# Patient Record
Sex: Female | Born: 1935 | Race: White | Hispanic: No
Health system: Southern US, Community
[De-identification: ages and names within clinical notes are randomized; demographics above are authoritative.]

---

## 2003-12-18 ENCOUNTER — Inpatient Hospital Stay: Payer: Self-pay | Admitting: Internal Medicine

## 2003-12-18 ENCOUNTER — Ambulatory Visit: Payer: Self-pay | Admitting: Internal Medicine

## 2003-12-25 ENCOUNTER — Inpatient Hospital Stay: Payer: Self-pay | Admitting: Internal Medicine

## 2003-12-25 ENCOUNTER — Other Ambulatory Visit: Payer: Self-pay

## 2007-06-19 ENCOUNTER — Ambulatory Visit: Payer: Self-pay | Admitting: Gynecologic Oncology

## 2007-06-21 ENCOUNTER — Ambulatory Visit: Payer: Self-pay | Admitting: Gynecologic Oncology

## 2007-06-22 ENCOUNTER — Ambulatory Visit: Payer: Self-pay | Admitting: Gynecologic Oncology

## 2007-07-06 ENCOUNTER — Ambulatory Visit: Payer: Self-pay | Admitting: Gynecologic Oncology

## 2007-07-17 ENCOUNTER — Ambulatory Visit: Payer: Self-pay | Admitting: Gynecologic Oncology

## 2007-07-24 ENCOUNTER — Inpatient Hospital Stay: Payer: Self-pay | Admitting: Gynecologic Oncology

## 2007-08-07 ENCOUNTER — Ambulatory Visit: Payer: Self-pay | Admitting: Gynecologic Oncology

## 2007-09-18 ENCOUNTER — Ambulatory Visit: Payer: Self-pay | Admitting: Gynecologic Oncology

## 2007-10-30 ENCOUNTER — Ambulatory Visit: Payer: Self-pay | Admitting: Gynecologic Oncology

## 2008-01-02 ENCOUNTER — Ambulatory Visit: Payer: Self-pay | Admitting: Gynecologic Oncology

## 2008-01-06 ENCOUNTER — Ambulatory Visit: Payer: Self-pay | Admitting: Gynecologic Oncology

## 2008-01-08 ENCOUNTER — Ambulatory Visit: Payer: Self-pay | Admitting: Gynecologic Oncology

## 2008-05-20 ENCOUNTER — Ambulatory Visit: Payer: Self-pay | Admitting: Gynecologic Oncology

## 2008-07-25 ENCOUNTER — Ambulatory Visit: Payer: Self-pay | Admitting: Ophthalmology

## 2008-08-05 ENCOUNTER — Ambulatory Visit: Payer: Self-pay | Admitting: Ophthalmology

## 2008-09-04 ENCOUNTER — Ambulatory Visit: Payer: Self-pay | Admitting: Gynecologic Oncology

## 2008-09-22 ENCOUNTER — Ambulatory Visit: Payer: Self-pay | Admitting: Gynecologic Oncology

## 2008-09-30 ENCOUNTER — Ambulatory Visit: Payer: Self-pay | Admitting: Gynecologic Oncology

## 2008-10-05 ENCOUNTER — Ambulatory Visit: Payer: Self-pay | Admitting: Gynecologic Oncology

## 2008-11-05 ENCOUNTER — Ambulatory Visit: Payer: Self-pay | Admitting: Gynecologic Oncology

## 2008-11-11 ENCOUNTER — Ambulatory Visit: Payer: Self-pay | Admitting: Gynecologic Oncology

## 2008-11-12 ENCOUNTER — Ambulatory Visit: Payer: Self-pay | Admitting: Gynecologic Oncology

## 2008-11-18 ENCOUNTER — Inpatient Hospital Stay: Payer: Self-pay | Admitting: Obstetrics and Gynecology

## 2008-12-05 ENCOUNTER — Ambulatory Visit: Payer: Self-pay | Admitting: Gynecologic Oncology

## 2008-12-09 ENCOUNTER — Ambulatory Visit: Payer: Self-pay | Admitting: Gynecologic Oncology

## 2009-01-05 ENCOUNTER — Ambulatory Visit: Payer: Self-pay | Admitting: Gynecologic Oncology

## 2009-02-04 ENCOUNTER — Ambulatory Visit: Payer: Self-pay | Admitting: Gynecologic Oncology

## 2009-03-07 ENCOUNTER — Ambulatory Visit: Payer: Self-pay | Admitting: Gynecologic Oncology

## 2009-04-07 ENCOUNTER — Ambulatory Visit: Payer: Self-pay | Admitting: Gynecologic Oncology

## 2009-06-01 ENCOUNTER — Ambulatory Visit: Payer: Self-pay | Admitting: Gynecologic Oncology

## 2009-09-04 ENCOUNTER — Ambulatory Visit: Payer: Self-pay | Admitting: Gynecologic Oncology

## 2009-09-15 ENCOUNTER — Ambulatory Visit: Payer: Self-pay | Admitting: Gynecologic Oncology

## 2009-10-05 ENCOUNTER — Ambulatory Visit: Payer: Self-pay | Admitting: Gynecologic Oncology

## 2009-10-20 ENCOUNTER — Ambulatory Visit: Payer: Self-pay | Admitting: Gynecologic Oncology

## 2009-11-05 ENCOUNTER — Ambulatory Visit: Payer: Self-pay | Admitting: Gynecologic Oncology

## 2009-12-05 ENCOUNTER — Ambulatory Visit: Payer: Self-pay | Admitting: Gynecologic Oncology

## 2009-12-22 ENCOUNTER — Ambulatory Visit: Payer: Self-pay | Admitting: Gynecologic Oncology

## 2010-01-05 ENCOUNTER — Ambulatory Visit: Payer: Self-pay | Admitting: Gynecologic Oncology

## 2010-03-02 ENCOUNTER — Ambulatory Visit: Payer: Self-pay | Admitting: Ophthalmology

## 2010-03-09 ENCOUNTER — Ambulatory Visit: Payer: Self-pay | Admitting: Ophthalmology

## 2010-06-29 ENCOUNTER — Ambulatory Visit: Payer: Self-pay | Admitting: Radiation Oncology

## 2010-08-15 ENCOUNTER — Inpatient Hospital Stay: Payer: Self-pay | Admitting: Internal Medicine

## 2011-12-06 ENCOUNTER — Ambulatory Visit: Payer: Self-pay | Admitting: Internal Medicine

## 2011-12-30 ENCOUNTER — Inpatient Hospital Stay: Payer: Self-pay | Admitting: Family Medicine

## 2011-12-30 LAB — TROPONIN I: Troponin-I: 8 ng/mL — ABNORMAL HIGH

## 2011-12-30 LAB — APTT: Activated PTT: 29.9 secs (ref 23.6–35.9)

## 2011-12-30 LAB — BASIC METABOLIC PANEL
Anion Gap: 8 (ref 7–16)
BUN: 36 mg/dL — ABNORMAL HIGH (ref 7–18)
Calcium, Total: 9.1 mg/dL (ref 8.5–10.1)
Chloride: 102 mmol/L (ref 98–107)
Creatinine: 1.35 mg/dL — ABNORMAL HIGH (ref 0.60–1.30)
Glucose: 150 mg/dL — ABNORMAL HIGH (ref 65–99)
Osmolality: 289 (ref 275–301)
Potassium: 4.5 mmol/L (ref 3.5–5.1)

## 2011-12-30 LAB — PROTIME-INR: Prothrombin Time: 13.9 secs (ref 11.5–14.7)

## 2011-12-30 LAB — URINALYSIS, COMPLETE
Bacteria: NONE SEEN
Bilirubin,UR: NEGATIVE
Blood: NEGATIVE
Glucose,UR: NEGATIVE mg/dL (ref 0–75)
Hyaline Cast: 27
Ketone: NEGATIVE
Nitrite: NEGATIVE
Squamous Epithelial: 1
WBC UR: 1 /HPF (ref 0–5)

## 2011-12-30 LAB — CK TOTAL AND CKMB (NOT AT ARMC): CK-MB: 12.7 ng/mL — ABNORMAL HIGH (ref 0.5–3.6)

## 2011-12-30 LAB — PRO B NATRIURETIC PEPTIDE: B-Type Natriuretic Peptide: 34383 pg/mL — ABNORMAL HIGH (ref 0–450)

## 2011-12-30 LAB — CBC
HCT: 41 % (ref 35.0–47.0)
HGB: 13.8 g/dL (ref 12.0–16.0)
MCH: 33 pg (ref 26.0–34.0)
Platelet: 171 10*3/uL (ref 150–440)
RBC: 4.18 10*6/uL (ref 3.80–5.20)

## 2011-12-31 LAB — APTT
Activated PTT: 124.4 secs — ABNORMAL HIGH (ref 23.6–35.9)
Activated PTT: 105.1 secs — ABNORMAL HIGH (ref 23.6–35.9)

## 2012-01-01 LAB — CBC WITH DIFFERENTIAL/PLATELET
Basophil #: 0 10*3/uL (ref 0.0–0.1)
Basophil %: 0.5 %
Eosinophil %: 4.1 %
HCT: 39.8 % (ref 35.0–47.0)
HGB: 13.2 g/dL (ref 12.0–16.0)
Lymphocyte #: 1.9 10*3/uL (ref 1.0–3.6)
Lymphocyte %: 27.3 %
MCH: 33 pg (ref 26.0–34.0)
MCV: 100 fL (ref 80–100)
Monocyte %: 10.5 %
Neutrophil #: 4.1 10*3/uL (ref 1.4–6.5)
RDW: 13.2 % (ref 11.5–14.5)
WBC: 7.1 10*3/uL (ref 3.6–11.0)

## 2012-01-01 LAB — BASIC METABOLIC PANEL
BUN: 28 mg/dL — ABNORMAL HIGH (ref 7–18)
Calcium, Total: 8.7 mg/dL (ref 8.5–10.1)
Chloride: 95 mmol/L — ABNORMAL LOW (ref 98–107)
Co2: 26 mmol/L (ref 21–32)
EGFR (African American): 43 — ABNORMAL LOW
EGFR (Non-African Amer.): 37 — ABNORMAL LOW
Glucose: 132 mg/dL — ABNORMAL HIGH (ref 65–99)
Potassium: 4.3 mmol/L (ref 3.5–5.1)
Sodium: 131 mmol/L — ABNORMAL LOW (ref 136–145)

## 2012-01-01 LAB — APTT: Activated PTT: 74.9 secs — ABNORMAL HIGH (ref 23.6–35.9)

## 2012-01-02 LAB — CBC WITH DIFFERENTIAL/PLATELET
Basophil %: 0.8 %
Eosinophil #: 0.3 10*3/uL (ref 0.0–0.7)
Eosinophil %: 3.8 %
HGB: 11.6 g/dL — ABNORMAL LOW (ref 12.0–16.0)
Lymphocyte #: 1.4 10*3/uL (ref 1.0–3.6)
Lymphocyte %: 20 %
MCH: 32.4 pg (ref 26.0–34.0)
MCHC: 32.9 g/dL (ref 32.0–36.0)
Monocyte #: 0.7 x10 3/mm (ref 0.2–0.9)
Neutrophil %: 64.6 %
Platelet: 171 10*3/uL (ref 150–440)

## 2012-01-02 LAB — BASIC METABOLIC PANEL
Anion Gap: 9 (ref 7–16)
Calcium, Total: 8.7 mg/dL (ref 8.5–10.1)
Co2: 27 mmol/L (ref 21–32)
Creatinine: 1.4 mg/dL — ABNORMAL HIGH (ref 0.60–1.30)
EGFR (African American): 36 — ABNORMAL LOW
Glucose: 106 mg/dL — ABNORMAL HIGH (ref 65–99)
Osmolality: 273 (ref 275–301)

## 2012-01-02 LAB — APTT: Activated PTT: 60.4 secs — ABNORMAL HIGH (ref 23.6–35.9)

## 2012-01-03 LAB — BASIC METABOLIC PANEL
Anion Gap: 13 (ref 7–16)
Calcium, Total: 8.8 mg/dL (ref 8.5–10.1)
Chloride: 99 mmol/L (ref 98–107)
Co2: 27 mmol/L (ref 21–32)
EGFR (Non-African Amer.): 43 — ABNORMAL LOW
Glucose: 91 mg/dL (ref 65–99)
Osmolality: 283 (ref 275–301)
Potassium: 3.8 mmol/L (ref 3.5–5.1)
Sodium: 139 mmol/L (ref 136–145)

## 2012-01-05 LAB — BASIC METABOLIC PANEL
Anion Gap: 9 (ref 7–16)
Calcium, Total: 9.4 mg/dL (ref 8.5–10.1)
Chloride: 96 mmol/L — ABNORMAL LOW (ref 98–107)
Co2: 30 mmol/L (ref 21–32)
Creatinine: 1.17 mg/dL (ref 0.60–1.30)
Sodium: 135 mmol/L — ABNORMAL LOW (ref 136–145)

## 2012-01-05 LAB — CBC WITH DIFFERENTIAL/PLATELET
Basophil #: 0 10*3/uL (ref 0.0–0.1)
HCT: 37.9 % (ref 35.0–47.0)
HGB: 13 g/dL (ref 12.0–16.0)
Lymphocyte #: 0.9 10*3/uL — ABNORMAL LOW (ref 1.0–3.6)
Lymphocyte %: 12.1 %
MCH: 33.6 pg (ref 26.0–34.0)
Monocyte %: 8.1 %
Neutrophil #: 5.6 10*3/uL (ref 1.4–6.5)
Neutrophil %: 77.8 %
Platelet: 230 10*3/uL (ref 150–440)
RBC: 3.86 10*6/uL (ref 3.80–5.20)
RDW: 13.3 % (ref 11.5–14.5)
WBC: 7.3 10*3/uL (ref 3.6–11.0)

## 2012-01-06 LAB — CBC WITH DIFFERENTIAL/PLATELET
Basophil #: 0 10*3/uL (ref 0.0–0.1)
Basophil %: 0.6 %
Eosinophil #: 0.1 10*3/uL (ref 0.0–0.7)
HCT: 38.5 % (ref 35.0–47.0)
HGB: 12.6 g/dL (ref 12.0–16.0)
Lymphocyte #: 1 10*3/uL (ref 1.0–3.6)
MCHC: 32.7 g/dL (ref 32.0–36.0)
MCV: 99 fL (ref 80–100)
Platelet: 247 10*3/uL (ref 150–440)
RBC: 3.9 10*6/uL (ref 3.80–5.20)
WBC: 5.6 10*3/uL (ref 3.6–11.0)

## 2012-01-06 LAB — COMPREHENSIVE METABOLIC PANEL
Albumin: 3.2 g/dL — ABNORMAL LOW (ref 3.4–5.0)
Anion Gap: 8 (ref 7–16)
Calcium, Total: 9.2 mg/dL (ref 8.5–10.1)
Chloride: 97 mmol/L — ABNORMAL LOW (ref 98–107)
Co2: 32 mmol/L (ref 21–32)
EGFR (African American): 49 — ABNORMAL LOW
Osmolality: 280 (ref 275–301)
Potassium: 4 mmol/L (ref 3.5–5.1)
SGOT(AST): 41 U/L — ABNORMAL HIGH (ref 15–37)
SGPT (ALT): 36 U/L (ref 12–78)
Sodium: 137 mmol/L (ref 136–145)

## 2012-01-09 DIAGNOSIS — I5022 Chronic systolic (congestive) heart failure: Secondary | ICD-10-CM

## 2012-01-09 DIAGNOSIS — I209 Angina pectoris, unspecified: Secondary | ICD-10-CM

## 2012-01-10 DIAGNOSIS — I209 Angina pectoris, unspecified: Secondary | ICD-10-CM

## 2012-01-10 DIAGNOSIS — I2510 Atherosclerotic heart disease of native coronary artery without angina pectoris: Secondary | ICD-10-CM

## 2012-01-10 DIAGNOSIS — I5022 Chronic systolic (congestive) heart failure: Secondary | ICD-10-CM

## 2012-01-10 DIAGNOSIS — K219 Gastro-esophageal reflux disease without esophagitis: Secondary | ICD-10-CM

## 2012-01-11 ENCOUNTER — Telehealth: Payer: Self-pay | Admitting: Internal Medicine

## 2012-01-11 DIAGNOSIS — I209 Angina pectoris, unspecified: Secondary | ICD-10-CM

## 2012-01-12 ENCOUNTER — Telehealth: Payer: Self-pay | Admitting: Internal Medicine

## 2012-01-12 NOTE — Telephone Encounter (Signed)
Not surprising Had severe post infarct angina with hypotension and CHF

## 2012-01-12 NOTE — Telephone Encounter (Signed)
To: [facility] (After Hours Triage) Fax: [phone] From: [facility] Date/ Time: 02-03-12 8:38 PM Taken By: Eduardo Osier, CSR Caller: Maisie Fus Facility: [facility] Patient: Elizabeth, Johns DOB: October 20, 1912 Phone: [phone] Reason for Call: Maisie Fus is calling from [facility] regarding the death of [name]. Patient of [name] [facility]). The patient expired on 03-Feb-2012 at 20:30. Regarding Appointment: Appt Date: Appt Time: Unknown Provider: Reason: Details: Outcome:

## 2012-02-05 ENCOUNTER — Ambulatory Visit: Payer: Self-pay | Admitting: Internal Medicine

## 2012-02-05 NOTE — Telephone Encounter (Signed)
Discussed with Elnita Maxwell the nurse this morning Not doing great but no emergent problems now

## 2012-02-05 NOTE — Telephone Encounter (Signed)
[facility] Triage Call Report Triage Record Num: [id number] Operator: Craig Guess Patient Name: Elizabeth Johns Call Date & Time: 01/26/2012 12:59:58AM Patient Phone: [phone] PCP: [name] Patient Gender: Female PCP Fax : [phone] Patient DOB: [birth year] Practice Name: [facility] Reason for Call: Caller: Marie/LPN; PCP: [name] (Family Practice); CB#: [phone]; Call regarding orders, for lung problems. Caller reports this patient came to this facility on 11/1 after a 6 day hospital stay for heart and abdominal problems. She has a new order for Roxinal 0.25-.36ml q 1 hr prn dyspnea or chest pain and was given several doses on 2nd shift. Caller reports she has rhonchi and rales in all lung fields and is wheezing. She has a cough that began today per Mrs Schnitzer. She is a DNR and family does not want her sent to the ED. Caller asking for some respiratory treatments and maybe some Lasix. Current VS are 140/80, 68, 24 and temp is 96.8. She has oxygen at 1L. Per Breathing Problems Protocol, Activate EMS 911. RN provided the following standing orders from Practice Profile since family does not want her sent to ED. "If pt is afebrile and has hx of CHF and VS are stable: start oxygen at 2l/nasal cannula and given 40mg  of Lasix IM x 1 and monitor. If wheezing, give Albuterol (2.5mg ) x 1 by nebulizer and monitor status. May repeat x1 in 20 mins if needed." Caller advised to be sure MD is aware of these changes in the am and advised to call this RN back if these interventions do not improve her condition. RN set up follow up call for 3 am and will advise MD of use of standing orders vs 911 in the am. 3:03 am, RN spoke with Hilda Lias who reports she is doing better and is sleeping at present. O2 Saturations are 95-96% and wheezing has improved. She has not voided yet. Caller advised to callback as needed and is agreeable. 06:55 Dr Amador Cunas advised of orders initiated  for this resident in the early am hours. In agreement with treatment from Standing orders. Protocol(s) Used: Breathing Problems Recommended Outcome per Protocol: Activate EMS 911 Reason for Outcome: Worsening breathing problems AND known cardiac or respiratory condition (coronary artery disease, angina, heart failure, asthma, chronic bronchitis, COPD) NOT responding to treatment OR treatment not available. Care Advice: ~ 01-26-12 7:00:05AM Page 1 of 1 [email or web address]

## 2012-02-05 DEATH — deceased

## 2012-08-08 ENCOUNTER — Ambulatory Visit: Payer: Self-pay | Admitting: Internal Medicine

## 2013-08-04 IMAGING — CR DG ABDOMEN 3V
1 series · 5 of 5 positions shown · non-contrast
Comparison: none

REASON FOR EXAM: abdominal pain,  NSTEMI
COMMENTS:   Bedside (portable):Y

[Series 1: erect ap · 0.17mm/px · 5 of 5 slices shown]
[im 1/5]
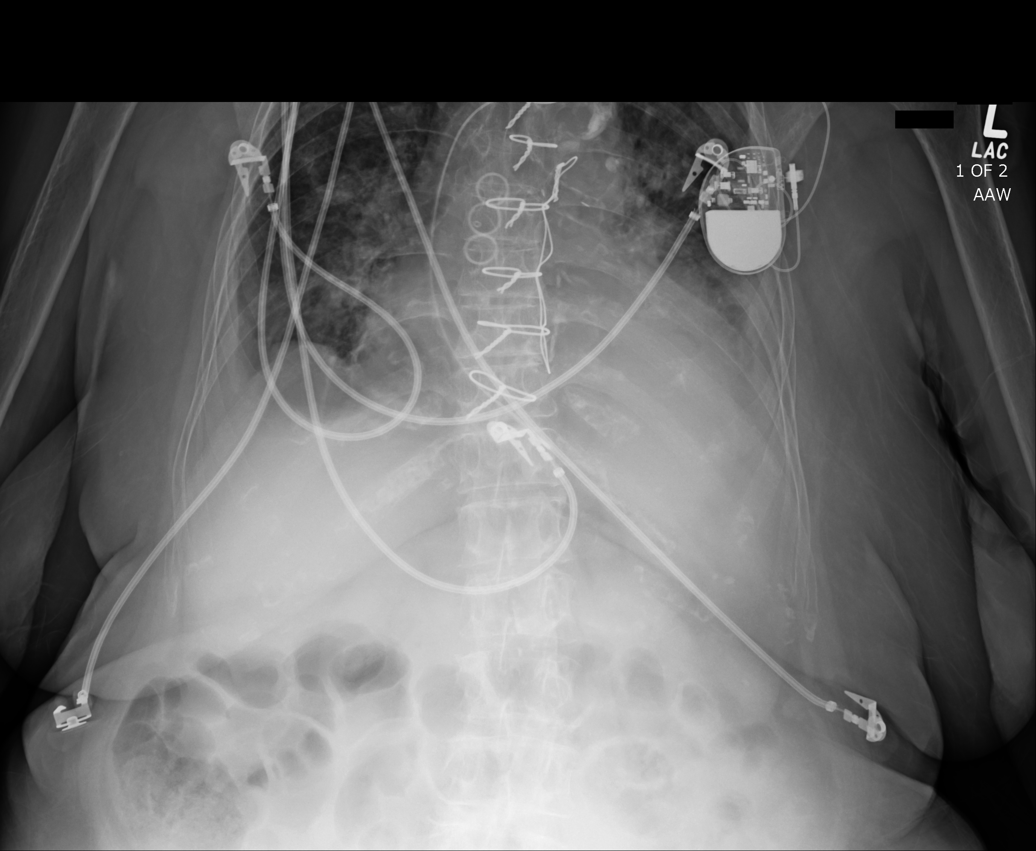
[im 2/5]
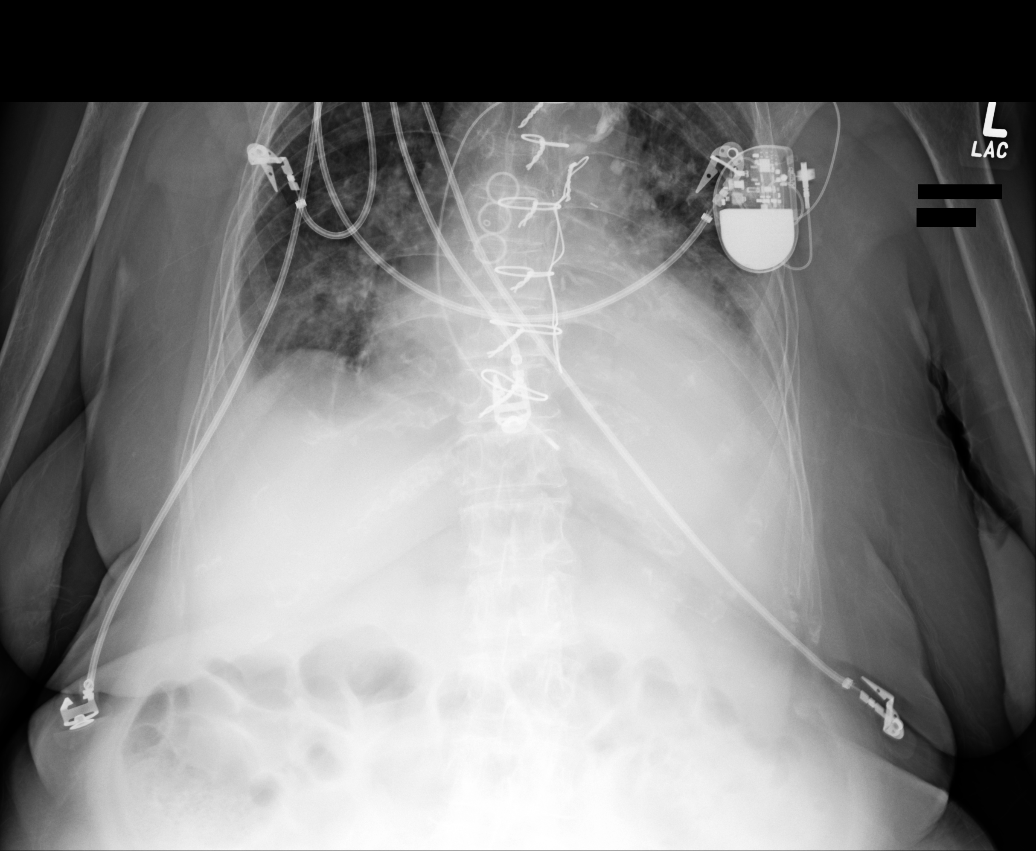
[im 3/5]
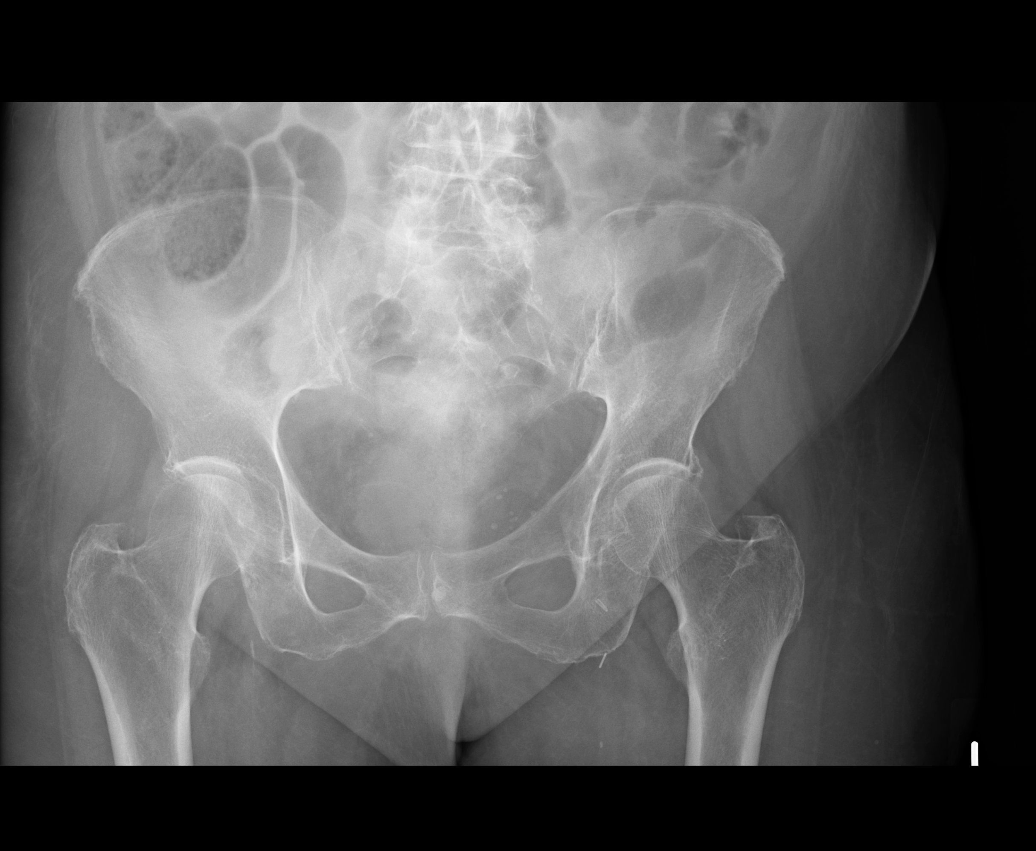
[im 4/5]
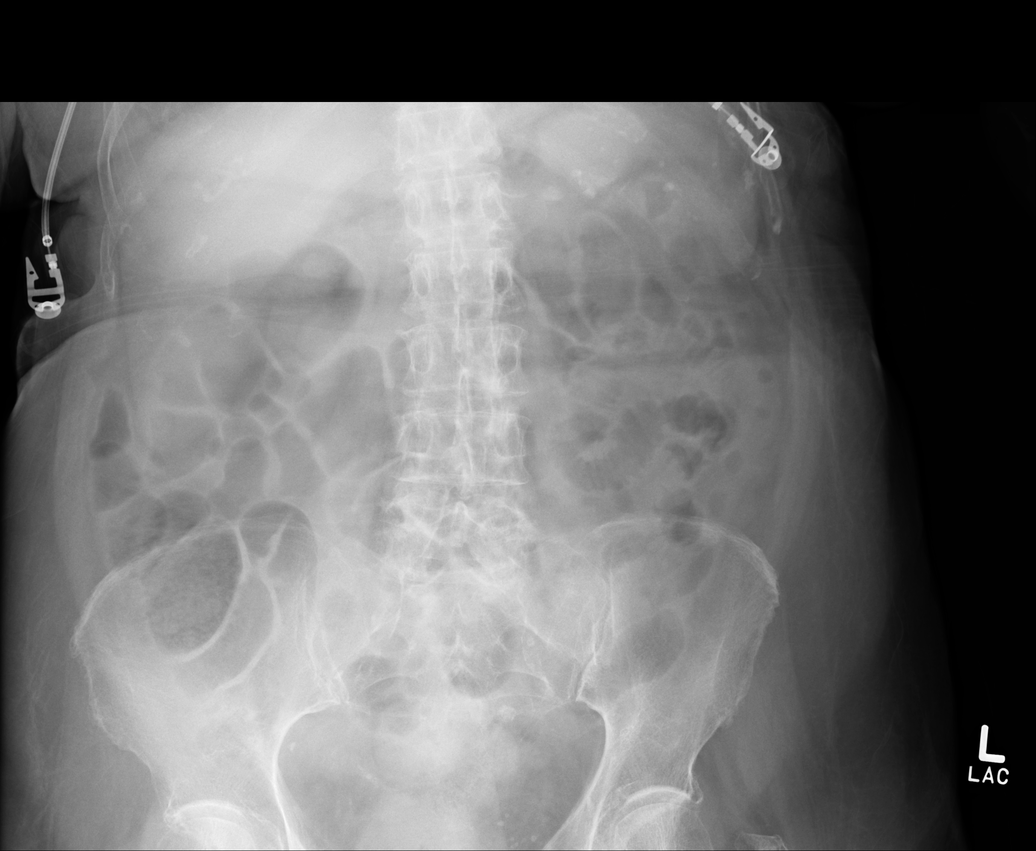
[im 5/5]
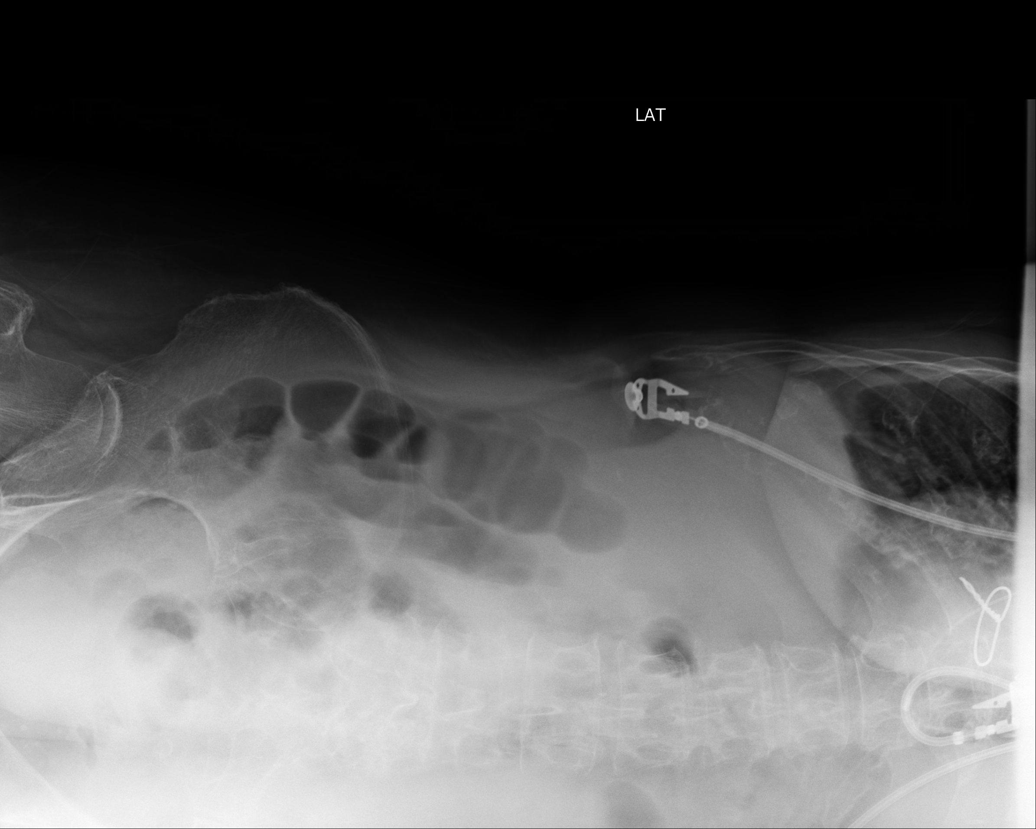

[5 of 5 positions shown; findings below may reference images not displayed]

PROCEDURE:     DXR - DXR ABDOMEN COMPLETE  - January 05, 2012  [DATE]

RESULT:     The cardiac silhouette is enlarged. Left-sided pacemaker device
is present. There appears to be some pulmonary vascular congestion versus
edema. There is no definite subdiaphragmatic free air. Air is seen within
loops of small bowel which are mildly prominent. Correlate for ileus versus
partial obstruction or evolving obstruction. A moderate amount of air and
fecal material is present in the colon.
IMPRESSION: No definite obstruction. No free air. Mildly prominent
air-filled loops of small bowel make it difficult to completely exclude
ileus or partial small bowel obstruction. CT is available for further
assessment if desired.

[REDACTED]

## 2014-06-24 NOTE — Discharge Summary (Signed)
PATIENT NAME:  Elizabeth Johns, Elizabeth Johns MR#:  [id number] DATE OF BIRTH:  Jun 24, 1912  DATE OF ADMISSION:  12/30/2011 DATE OF DISCHARGE:  01/04/2012   DISCHARGE DIAGNOSES:  1. NSTEMI.  2. RecuReggy Eyerrent angina.  3. Systolic congestive heart failure.  4. Gastroesophageal reflux disease with hiatal hernia.  5. Instability with fall risk.   DISCHARGE MEDICATIONS:  1. Aspirin 325 mg p.o. daily.  2. Atorvastatin 10 mg p.o. at bedtime.  3. Torsemide 10 mg p.o. daily. Hold if systolic blood pressure less than 100.  4. Tylenol 1 gram t.i.d. scheduled.  5. Coreg 3.125 mg p.o. b.i.d. Hold if systolic blood pressure less than 100.  6. Nexium 40 mg p.o. daily prior to breakfast.  7. Tramadol 50 mg p.o. q.6 hours p.r.n. for pain.  8. MiraLAX 17 grams p.o. daily p.r.n. for constipation.  9. TUMS 1 gram q.i.d. p.r.n. for indigestion.   CONSULT: Cardiology per Dr. Gwen PoundsKowalski    PROCEDURES: None.   PERTINENT LABS ON THE DAY OF DISCHARGE: Sodium 139, potassium 3.8, creatinine 1.09.   BRIEF HOSPITAL COURSE:  1. NSTEMI. The patient came in complaining of recurrent chest discomfort. She has a history of coronary artery disease followed by Dr. Gwen PoundsKowalski as an outpatient. Her initial cardiac enzymes were positive with a troponin of 8, 9, and CK-MB of 12.7. Her EKG at the time of admission showed sinus rhythm without new ST elevation, did show some mild ST depression in V4 through V6. After discussion with Dr. Gwen PoundsKowalski, no further invasive intervention was recommended. Medical management at this time. The patient is a DNR/DNI. The patient struggled with low blood pressure, therefore, Coreg and torsemide had to be held at times.  2. Systolic congestive heart failure. The patient had mild edema upon admission. Continued with torsemide which had to be held at times due to low blood pressure. She remains on oxygen at this time and maintained on oxygen to keep oxygen sats above 94%.  3. Other chronic medical issues stable. Continue  with home regimen. Will control her pain with Tylenol and Ultram.   DISPOSITION: [facility] is [facility]hab due to instability with gait and fall risk.   FOLLOW-UP: She needs to follow-up with Dr. Hyacinth MeekerMiller after her [facility].   ____________________________ Marisue IvanKanhka Jona Erkkila, MD kl:drc D: 01/04/2012 08:14:52 ET T: 01/04/2012 10:59:50 ET JOB#: [id number]  cc: Marisue IvanKanhka Niyati Heinke, MD, <Dictator> Marisue IvanKANHKA Daxx Tiggs MD ELECTRONICALLY SIGNED 01/09/2012 8:08

## 2014-06-24 NOTE — Consult Note (Signed)
Pt seen and examined. Please see Fransico SettersKim Mills' notes. Pt with hx of angina. Now r/i for MI. Now more with abd pain. Abd diffusely tender. No rebound or guarding. Labs ok this AM. 3 way abd series planned to check for ischemic bowel. If unrevealing, may need CT of abdomen. Pt is not a surgical candidate or candidate for any endoscopic evaluation. Family requests palliative care. Will follow. Thanks.  Electronic Signatures: Lutricia Feilh, Jaiana Sheffer (MD)  (Signed on 31-Oct-13 13:25)  Authored  Last Updated: 31-Oct-13 13:25 by Lutricia Feilh, Sonnie Pawloski (MD)

## 2014-06-24 NOTE — Consult Note (Signed)
PATIENT NAME:  Elizabeth Johns, Elizabeth Johns MR#:  [id number] DATE OF BIRTH:  1912/09/17  DATE OF CONSULTATION:  01/05/2012  REFERRING PHYSICIAN:  Dr. Burnadette PopLinthavong  CONSULTING PHYSICIAN:  Ranae PlumberKimberly A. Arvilla MarketMills, ANP / Dr. Lutricia FeilPaul Oh   REASON FOR CONSULTATION: Abdominal pain with vomiting large amounts of coffee-ground emesis after being given an enema.   HISTORY OF PRESENT ILLNESS: This 81 year old patient with history of severe coronary artery disease was hospitalized 12/30/2011 for chest pain, shortness of breath. Patient did rule in for and NSTEMI and did show elevated BNP with congestive heart failure on the chest x-ray. Patient had been given medical therapy for this myocardial infarction, but has continued to have recurrent angina, and history of reflux. Family reports she has a large hiatus hernia. Patient was about ready to be discharged to [facility] when she had not had a bowel movement for several days. She was given MiraLax, and a few Fleet enemas. She did have results with those enemas, but then developed an episode of vomiting that appeared brown to the nursing staff without fresh blood and Hemoccult negative. Patient had been drinking prune juice prior. GI has been asked to see patient for further evaluation and management.   This patient is resting in bed, rubbing her abdomen and moaning with abdominal discomfort and cramps. At her bedside is her son-in-law, Dr. Lenard ForthMundy, who is a retired Lawyerlocal anesthesiologist. The patient lives with Dr. Lenard ForthMundy and his wife. Dr. Lenard ForthMundy reports that the patient has had epigastric crampy discomfort for the last 2 to 3 days. Normally her GI health is quite excellent with good appetite, diet and no abdominal pain. She has been on Protonix twice daily chronically for years and quite stable. No prior history of EGD or colonoscopy study. She has had a lot of problems with her heart, and has had severe angina with very minimal activity at home. Her lifestyle is bedrest, four steps to the  bathroom back to bed. A couple of days before admission even this was giving her severe chest pain and she was so weak she was not able to ambulate. Currently, patient does get angina with exertion getting off the bedside commode. Her appetite has been poor today, she has been offered apple juice and sauce and has had a few spoonfuls of oatmeal. Vomiting episode occurred last night and no further vomiting today. Vital signs have been stable, she is afebrile with unremarkable laboratory studies.   PAST MEDICAL HISTORY:  1. Coronary artery disease status post coronary artery bypass graft x2.  2. Congestive heart failure.  3. Cardiomyopathy with ejection fraction 20%.  4. Hyperlipidemia.  5. Macular degeneration.  6. Hiatus hernia.  7. History of vulvar cancer treated with radiation therapy.   PAST SURGICAL HISTORY:  1. Pacemaker placement.  2. Coronary artery bypass graft x2.  3. Total abdominal hysterectomy.  4. Cesarean section.  5. Vulvectomy.    ADMISSION MEDICATIONS:  1. Lipitor 10 mg daily. 2. Klor-Con 20 mEq daily. 3. Torsemide 20 mg daily. 4. Coreg 3.125 mg twice daily.  5. Protonix 40 mg twice daily. 6. Aspirin 81 mg daily.   ALLERGIES: No known drug allergies.   HABITS: Negative tobacco or alcohol.   SOCIAL HISTORY: Lives at home with her daughter and son-in-law, Dr. Lenard ForthMundy.    FAMILY HISTORY: Not reviewed.   REVIEW OF SYSTEMS: Patient is unable to give good review of systems, reports abdominal discomfort, more of a chronic feeling, cannot really describe it or characterize. Patient unable to give  accurate review of systems.   PHYSICAL EXAMINATION:  VITAL SIGNS: Temperature 96.2, pulse 65, respirations 18, blood pressure 100/61, pulse oximetry 97% 3 liters nasal cannula.   GENERAL: Elderly Caucasian female sitting in semi-Fowler position in bed moaning with abdominal discomfort, rubbing her abdomen. She is sleeping intermittent. Alert and awake intermittently as well.    HEENT: Head is normocephalic. Conjunctivae pale, pink. Sclerae anicteric. Oral mucosa is dry and intact.   NECK: With trachea midline.   HEART: Heart tones S1 and S2 with positive systolic murmur.    LUNGS: Bibasilar crackles.   ABDOMEN: Soft. Bowel sounds are present. Patient reports tenderness diffusely bilateral abdomen with light palpation, moans out in pain. Is no rigidity, rebound, or guarding. Abdomen is soft.   RECTAL: Deferred.   EXTREMITIES: Lower extremities without edema.   SKIN: Warm and dry without rash or jaundice or cyanosis. Color is pale.   NEUROLOGIC: Patient is aware of her name, place, answers questions vaguely about abdominal pain. Speech is clear, gait not evaluated.   LABORATORY, DIAGNOSTIC AND RADIOLOGICAL DATA: Laboratories 01/05/2012 with WBC 7.3, hemoglobin 13.0. CBC normal. PTT was elevated on heparin drip, discontinued 10/28.   Admission BNP was 34,383 with BUN 36, creatinine 1.35. BUN is now 30, creatinine 1.17, sodium 135, potassium 4.5. Her troponin was 8 and then up to 9.0 on 12/31/2011. BUN today 30, creatinine 1.17, sodium 135, glucose 105. BNP on admission was 34,383.   Admission chest x-ray, portable, single view, showed congestive heart failure with pulmonary interstitial edema and may have small left-sided pleural effusion.   IMPRESSION: This 81 year old patient with history of unstable angina presented with chest pain, shortness of breath and ruled in for subendocardial myocardial infarction with underlying acute on chronic congestive heart failure. She has baseline cardiomyopathy with ejection fraction 20%. She has had borderline low blood pressures during this admission. Patient was given laxatives for constipation and developed episode of vomiting, looked brown to the nursing staff. Patient did consume prune juice. This was Hemoccult negative. Hemoglobin has been normal. Patient has developed abdominal discomfort today, appears quite severe at  time of evaluation with patient moaning and rubbing her abdomen. Nonsurgical abdomen by exam with soft abdomen, bowel sounds present. No rigidity, rebound, or guarding. Etiology for abdominal discomfort to include constipation, side effects from the laxative, MiraLax, causing cramping. May also need to consider more significant problem such as early ischemic colitis given patient with severe cardiovascular disease.   PLAN: This case was discussed with Dr. Bluford Kaufmann in collaboration of care. Plans for x-ray imaging and I discussed x-ray of choice with Dr. Register and we decided on three-way abdomen portable would be a good place to start. Consider for oral contrast CT later today as needed. Dr. Lenard Forth is her son-in-law, does report that with patient's advanced age and severe cardiovascular history he and the family are requesting palliative care but they would like to be able to get an answer regarding the etiology of this abdominal pain, manage it with medication if possible and transfer her to the nursing home, [facility]. His idea has been to get her to the nursing home, work on strengthening her so that she would be able to come back home. Will start with the x-ray and make further GI recommendations as needed.   These services provided by Cala Bradford A. Arvilla Market, MS, APRN, BC, ANP under collaborative agreement with Dr. Lutricia Feil.   ____________________________ Ranae Plumber. Arvilla Market, ANP kam:cms D: 01/05/2012 16:55:55 ET T: 01/06/2012 06:20:31 ET  JOB#: [id number]  cc: Cala Bradford A. Arvilla Market, ANP, <Dictator> Danella Penton, MD Ranae Plumber. Suzette Battiest, MSN, ANP-BC Adult Nurse Practitioner ELECTRONICALLY SIGNED 01/06/2012 17:59

## 2014-06-24 NOTE — Consult Note (Signed)
Brief Consult Note: Diagnosis: Acute NSTEMI, CHF admisssion with new onset 3 days of abdominal pain and episode of brown emesis with laxative use.   Patient was seen by consultant.   Comments: Over the last few days she has c/o diffuse mid abd ache, grimacing and very tender soft abd w light palpation. Positive bowel sounds-active, had good results yest w miralax and fleets. Recent new MI acute on chronic low cardiac output, BP 100/61, warm and dry skin, no chest pain now. Positive SOB. PLAN: KUB , NPO for now. Consider CT for ischemic bowel w severe CVD. Her son in law-Dr. Lenard ForthMundy in the room wants palliative care d/t adv age.  I will d/w Dr. Bluford Kaufmannh in collaboration of care.  Electronic Signatures: Rowan BlaseMills, Norvil Martensen Ann (NP)  (Signed 31-Oct-13 12:44)  Authored: Brief Consult Note   Last Updated: 31-Oct-13 12:44 by Rowan BlaseMills, Shawnese Magner Ann (NP)

## 2014-06-24 NOTE — Consult Note (Signed)
PATIENT NAME:  Elizabeth Johns, Elizabeth Johns MR#:  [id number] DATE OF BIRTH:  09/18/1912  DATE OF CONSULTATION:  12/30/2011  REFERRING PHYSICIAN:  Dr. Jacques NavyAhmadzia  CONSULTING PHYSICIAN:  Lamar BlinksBruce J. Tharon Bomar, MD  REASON FOR CONSULTATION: Acute myocardial infarction with known cardiomyopathy and coronary artery disease, status post pacemaker placement.   CHIEF COMPLAINT: Patient is somewhat obtunded and has right arm pain.   HISTORY OF PRESENT ILLNESS: This is a 81 year old female with known severe dilated cardiomyopathy, hypertension, hyperlipidemia, coronary artery disease status post coronary artery bypass graft, multiple myocardial infarction, sick sinus syndrome status post pacemaker placement who has had new onset of chest discomfort over the last two days resulting in some hypotension, chest discomfort, right arm area and now has an EKG showing sinus rhythm with ventricular pacing and a troponin of 8. The patient is comfortable at this time with lower blood pressure in the 80 systolic range. The patient currently cannot assess or tell us further about her review of systems due to obtundation.   PAST MEDICAL HISTORY:  1. Coronary artery disease.  2. Hypertension.  3. Hyperlipidemia.  4. Heart block status post pacemaker. 5. Cardiomyopathy. 6. Congestive heart failure.   FAMILY HISTORY: No family members with early onset of cardiovascular disease or hypertension.   SOCIAL HISTORY: Currently denies alcohol or tobacco use.   ALLERGIES: No known drug allergies.   CURRENT MEDICATIONS: As listed.    PHYSICAL EXAMINATION:  VITAL SIGNS: Blood pressure 80/50 bilaterally, heart rate 72 upright.   GENERAL: She is an ill-appearing elderly female in no acute distress.   HEENT: No icterus, thyromegaly, ulcers, hemorrhage, or xanthelasma.   CARDIOVASCULAR: Regular rate and rhythm. Normal S1, S2. 3/6 apical murmur consistent with mitral regurgitation. Point of maximal impulse is diffuse. Carotid upstroke normal  without bruit. Jugular venous pressure normal.   LUNGS: Lungs have bibasilar crackles with normal respirations.   ABDOMEN: Soft, nontender without hepatosplenomegaly or masses. Abdominal aorta is normal size without bruit.   EXTREMITIES: 2+ bilateral pulses in dorsal, pedal, radial, and femoral arteries without lower extremity edema, cyanosis, clubbing, ulcers.   NEUROLOGIC: She is not oriented and is slightly weak at this time.   ASSESSMENT: 81 year old female with known severe dilated cardiomyopathy, hypertension, hyperlipidemia, congestive heart failure due to acute myocardial infarction with elevated troponin and heart block status post pacemaker which is working well.   RECOMMENDATIONS:  1. Admit to telemetry with telemetry unit nursing.  2. No invasive procedures due to advanced age and patient and also family wishes.  3. Continue surveillance of extent of pulmonary edema and congestive heart failure with intravenous Lasix as necessary. 4. Oxygen for further risk reduction in myocardial infarction.  5. Abstain from any medication management including carvedilol due to hypotension.  6. Comfort care measures thereafter.   ____________________________ Lamar BlinksBruce J. Dezyre Hoefer, MD bjk:cms D: 12/30/2011 17:34:35 ET T: 12/31/2011 12:41:06 ET  JOB#: [id number] cc: Lamar BlinksBruce J. Sylvester Minton, MD, <Dictator> Lamar BlinksBRUCE J Khaleelah Yowell MD ELECTRONICALLY SIGNED 01/03/2012 8:46

## 2014-06-24 NOTE — H&P (Signed)
PATIENT NAME:  Elizabeth Johns, Ellizabeth MR#:  [id number] DATE OF BIRTH:  12/15/1912  DATE OF ADMISSION:  12/30/2011  PRIMARY CARE PHYSICIAN: Bethann PunchesMark Miller, [facility]  ER REFERRING PHYSICIAN: Su Leyobert L. Kinner, MD    CHIEF COMPLAINT: Chest pain and shortness of breath.   HISTORY OF PRESENT ILLNESS: Ms. Elizabeth Johns is 81 year old Caucasian female with past medical history significant for coronary artery disease, status post bypass surgery and stent, cardiomyopathy, ejection fraction 20%, hyperlipidemia, history of vulvar cancer status post surgery and radiation. She has complaint of angina on and off with exertion, after eating, but which gets resolved after taking a rest within a few minutes. That is baseline for her. For the last two days she has been complaining of having this anginal pain continuously and not getting relief by pain, and she is getting progressively more and more short of breath to the level that now she is unable to finish a sentence while talking to family. The family also noticed engorged veins on her face and forehead while she was sitting earlier today morning. Her blood pressure was running on the lower side, so the family did not give her any nitroglycerin but tried to relieve the pain with Tylenol 150 mg oral suspension. She received this dose 3 until now since yesterday night, and that relieves pain for a few hours but then the pain comes back. The pain is radiating to her back to the left side of the chest and goes to the left arm and right arm. The family denies any history of cough, fever or altered mental status.  This full history is obtained by the patient's daughter and son-in-law present in the room as the patient is unable to give any history. After arriving to the Emergency Room, her blood pressure dropped once to 70/40, but then it came up by itself up to 80/40.  [facility] were contacted to admit the patient for further medical management.   PAST MEDICAL HISTORY:   1. Coronary artery disease, status post coronary artery bypass graft surgery x2.  2. Hyperlipidemia.  3. Congestive heart failure.  4. Cardiomyopathy, ejection fraction 20%. 5. Macular degeneration.  6. Hiatal hernia.   PAST SURGICAL HISTORY:  1. Pacemaker placement.  2. Coronary artery bypass surgery x2.  3. Total abdominal hysterectomy.  4. Cesarean section. 5. Vulvectomy.  6. History of  vulvar cancer status post resection and treatment, radiation for recurrence.   ALLERGIES: No known drug allergy.   HOME MEDICATIONS:  1. Lipitor 10 mg daily.  2. Klor-Con 20 mEq daily.  3. Torsemide 20 mg. 4. Coreg 3.125 mg p.o. b.i.d.  5. Protonix 40 mg p.o. b.i.d.  6. Aspirin 81 mg p.o. daily.   SOCIAL HISTORY: She lives at home with daughter and son-in-law. No smoking or alcohol.   FAMILY HISTORY: He is unaware of any history of cancer in family members.   REVIEW OF SYSTEMS: Review of systems is unable to be assessed as the patient is in pain and due to old age. She is not very oriented, so she is unable to tell but is complaining of pain in the left side of chest, left and right arm.   PHYSICAL EXAMINATION:  VITAL SIGNS: Temperature 96.8, pulse rate 87, blood pressure 83/64, pulse oximetry 96% on nasal cannula supplementation 2 liters per minute.   GENERAL: A very frail, elderly female lying in the bed and distress due to chest pain.   HEENT: Normocephalic, atraumatic pupils equal, reacting to light. Anicteric  sclerae. Oropharynx clear and mucosa moist.   NECK: Supple. JVD present. No lymphadenopathy.   LUNGS: Respiratory rate of 25 to 30 with coarse crepitations present bilateral lungs, use of accessory muscles present.  CARDIOVASCULAR: S1, S2 present, regular, systolic murmur heard.   ABDOMEN: Soft, nontender, nondistended. No organomegaly. Bowel sounds are normal.   EXTREMITIES: No pedal edema. No clubbing or cyanosis.   SKIN:  No acne or rash.   NEUROLOGIC: Cranial nerves  are grossly intact. There is no focal motor or sensory deficit.   PSYCHOLOGICAL: The patient is alert but in distress and kept on complaining of chest pain repeatedly, chest pain and right arm pain repeatedly.   LABORATORY, DIAGNOSTIC AND RADIOLOGICAL DATA: Chest x-ray findings are consistent with congestive heart failure and pulmonary interstitial edema, may be a small left-sided pleural effusion present. WBC is 9.3, hemoglobin 13.8, platelet count 171, mean corpuscular volume 98. Glucose 150, BUN 36, creatinine 1.35, sodium 139, potassium 4.5, chloride 102, CO2 29, calcium 9.1.  Troponin 8.0. CK total 217, CK-MB 12.7. B-type natriuretic peptide 34,383.   ASSESSMENT: Acute non ST-elevation myocardial infarction with acute on chronic congestive heart failure. Troponin is 8, but there are no EKG changes compared to the previous one available from 08/2010, so we will treat as non-ST elevation MI. She has congestive heart failure, extensive pulmonary edema, and crackles present on chest examination and confirmed by x-ray of the chest.  Ejection fraction is 25 to 30% as per the previous records available in the chart. Her blood pressure is running on the lower side, so we cannot give IV or p.o. or any antihypertensive medications like beta Blocker and ACE inhibitor at this time and she is in congestive heart failure, so for low blood pressure we cannot give any IV fluid either; so we will just give aspirin and statin at this time. I spoke to the cardiologist on call, Dr. Gwen Pounds, and he suggested to start on IV heparin drip. I discussed with family about the situation and possible grave prognosis. They understand it very well, and their wish is just to give comfort care to the patient with minimal medical intervention. I clearly discussed with them about the possibility of drop in the heart rate or drop in the blood pressure further when she might need vasoactive drugs or atropine.  The son-in-law, who is present  at bedside, claims to be a retired Designer, industrial/product and he understands the situation very well; and he and the patient's daughter are present in the room. They agreed for not to use dopamine, dobutamine or atropine or any other resuscitative medication if that situation arises.  For pain management, I would prefer for it not to be morphine because it might drop down the blood pressure further, and she had good relief of chest pain with 150 mg of Tylenol liquid at home for 4 to 5 hours, so I will continue to give that to make her comfortable. We will call a Palliative Care consult for further management and comfort care of the patient.   Other problems: 1. Hyperlipidemia: We will continue statin.  2. Deep venous thrombosis prophylaxis: She is on heparin drip.  3. GI prophylaxis: We will give Nexium IV.  CODE STATUS: DO NOT RESUSCITATE.   The patient's condition, laboratory findings, plan and possible prognosis were discussed with the family in detail. I discussed with Dr. Gwen Pounds on the phone about the patient and his recommendations.   I will hand over the case to [facility] on-call  doctor in the evening.  TOTAL TIME SPENT: 50 minutes.   ____________________________ Hope Pigeon Elisabeth Pigeon, MD [id number] D: 12/30/2011 17:33:16 ET T: 12/30/2011 17:58:00 ET JOB#: [id number]  cc: Hope Pigeon. Elisabeth Pigeon, MD, <Dictator> Danella Penton, MD Heath Gold [facility] MD ELECTRONICALLY SIGNED 01/24/2012 22:00

## 2014-06-24 NOTE — Discharge Summary (Signed)
PATIENT NAME:  [name], Elizabeth Johns DATE OF BIRTH:  14-Jun-1912  DATE OF ADMISSION:  12/30/2011 DATE OF DISCHARGE:  01/06/2012  ADDENDUM  Please note that patient had an extended stay because of acute isolated episode of emesis with abdominal discomfort. She had a full workup by GI. The x-ray showed no signs of obstruction. Her symptoms have improved this more. She has a CT of the abdomen that is pending. Our plan is to stop the MiraLax at this time and place her on fiber water increase in her diet. She will continue to be [facility] further rehab.  ____________________________ Elizabeth IvanKanhka Jackilyn Umphlett, MD kl:cms D: 01/06/2012 08:23:50 ET T: 01/06/2012 09:23:31 ET JOB#: [id number]  cc: Elizabeth IvanKanhka Charmain Diosdado, MD, <Dictator>  Elizabeth IvanKANHKA Jashan Cotten MD ELECTRONICALLY SIGNED 01/09/2012 8:08
